# Patient Record
Sex: Female | Born: 1980 | Race: White | Hispanic: No | Marital: Single | Smoking: Never smoker
Health system: Southern US, Community
[De-identification: ages and names within clinical notes are randomized; demographics above are authoritative.]

## PROBLEM LIST (undated history)

## (undated) HISTORY — PX: COSMETIC SURGERY: SHX468

---

## 2005-06-23 ENCOUNTER — Observation Stay: Payer: Self-pay

## 2005-08-01 ENCOUNTER — Observation Stay: Payer: Self-pay | Admitting: Unknown Physician Specialty

## 2005-08-10 ENCOUNTER — Observation Stay: Payer: Self-pay | Admitting: Unknown Physician Specialty

## 2005-08-13 ENCOUNTER — Inpatient Hospital Stay: Payer: Self-pay | Admitting: Obstetrics & Gynecology

## 2009-10-04 ENCOUNTER — Ambulatory Visit: Payer: Self-pay | Admitting: Internal Medicine

## 2010-05-06 ENCOUNTER — Ambulatory Visit: Payer: Self-pay

## 2010-06-06 ENCOUNTER — Emergency Department: Payer: Self-pay | Admitting: Emergency Medicine

## 2010-08-13 ENCOUNTER — Emergency Department: Payer: Self-pay | Admitting: Unknown Physician Specialty

## 2011-04-08 ENCOUNTER — Inpatient Hospital Stay (HOSPITAL_COMMUNITY): Admission: AD | Admit: 2011-04-08 | Payer: Self-pay | Source: Ambulatory Visit | Admitting: Obstetrics & Gynecology

## 2011-10-02 ENCOUNTER — Emergency Department: Payer: Self-pay | Admitting: Emergency Medicine

## 2011-10-02 LAB — COMPREHENSIVE METABOLIC PANEL
Albumin: 4.3 g/dL (ref 3.4–5.0)
Anion Gap: 7 (ref 7–16)
BUN: 11 mg/dL (ref 7–18)
Bilirubin,Total: 0.9 mg/dL (ref 0.2–1.0)
Chloride: 103 mmol/L (ref 98–107)
Creatinine: 0.78 mg/dL (ref 0.60–1.30)
EGFR (African American): >60
Glucose: 107 mg/dL — ABNORMAL HIGH (ref 65–99)
Potassium: 4.5 mmol/L (ref 3.5–5.1)
Total Protein: 8 g/dL (ref 6.4–8.2)

## 2011-10-02 LAB — URINALYSIS, COMPLETE
Bacteria: NONE SEEN
Bilirubin,UR: NEGATIVE
Glucose,UR: NEGATIVE mg/dL (ref 0–75)
Leukocyte Esterase: NEGATIVE
RBC,UR: 85 /HPF (ref 0–5)
Squamous Epithelial: 2
WBC UR: 4 /HPF (ref 0–5)

## 2011-10-02 LAB — CBC WITH DIFFERENTIAL/PLATELET
Basophil #: 0 10*3/uL (ref 0.0–0.1)
Basophil %: 0.4 %
HGB: 13.3 g/dL (ref 12.0–16.0)
MCH: 31.4 pg (ref 26.0–34.0)
MCHC: 34 g/dL (ref 32.0–36.0)
MCV: 92 fL (ref 80–100)
Monocyte %: 5 %
Neutrophil #: 6.2 10*3/uL (ref 1.4–6.5)
Neutrophil %: 70.5 %
RBC: 4.23 10*6/uL (ref 3.80–5.20)

## 2011-10-02 LAB — PREGNANCY, URINE: Pregnancy Test, Urine: NEGATIVE m[IU]/mL

## 2011-10-09 ENCOUNTER — Emergency Department: Payer: Self-pay | Admitting: Emergency Medicine

## 2011-10-09 LAB — BASIC METABOLIC PANEL
Anion Gap: 11 (ref 7–16)
BUN: 12 mg/dL (ref 7–18)
Chloride: 101 mmol/L (ref 98–107)
Co2: 28 mmol/L (ref 21–32)
Osmolality: 279 (ref 275–301)
Potassium: 4.3 mmol/L (ref 3.5–5.1)
Sodium: 140 mmol/L (ref 136–145)

## 2011-10-09 LAB — URINALYSIS, COMPLETE
Bilirubin,UR: NEGATIVE
Nitrite: NEGATIVE
Ph: 7 (ref 4.5–8.0)
Protein: NEGATIVE
RBC,UR: 1 /HPF (ref 0–5)
Squamous Epithelial: 1
WBC UR: <1 /HPF (ref 0–5)

## 2011-10-09 LAB — CBC
HCT: 36.6 % (ref 35.0–47.0)
HGB: 12.3 g/dL (ref 12.0–16.0)
RDW: 11.8 % (ref 11.5–14.5)
WBC: 7 10*3/uL (ref 3.6–11.0)

## 2012-04-12 ENCOUNTER — Emergency Department: Payer: Self-pay | Admitting: *Deleted

## 2012-04-12 LAB — URINALYSIS, COMPLETE
Bilirubin,UR: NEGATIVE
Ketone: NEGATIVE
Leukocyte Esterase: NEGATIVE
Nitrite: NEGATIVE
Ph: 6 (ref 4.5–8.0)
Squamous Epithelial: <1

## 2012-04-12 LAB — CBC
MCH: 32 pg (ref 26.0–34.0)
MCHC: 34.9 g/dL (ref 32.0–36.0)
MCV: 92 fL (ref 80–100)
Platelet: 229 10*3/uL (ref 150–440)
RDW: 12.9 % (ref 11.5–14.5)
WBC: 10.5 10*3/uL (ref 3.6–11.0)

## 2012-04-12 LAB — WET PREP, GENITAL

## 2012-05-14 ENCOUNTER — Emergency Department: Payer: Self-pay | Admitting: Emergency Medicine

## 2012-05-14 LAB — COMPREHENSIVE METABOLIC PANEL
Albumin: 3.3 g/dL — ABNORMAL LOW (ref 3.4–5.0)
Alkaline Phosphatase: 83 U/L (ref 50–136)
Anion Gap: 9 (ref 7–16)
Calcium, Total: 9.4 mg/dL (ref 8.5–10.1)
Co2: 24 mmol/L (ref 21–32)
Creatinine: 0.43 mg/dL — ABNORMAL LOW (ref 0.60–1.30)
Glucose: 70 mg/dL (ref 65–99)
Osmolality: 269 (ref 275–301)
Potassium: 3.7 mmol/L (ref 3.5–5.1)
SGPT (ALT): 17 U/L (ref 12–78)
Sodium: 136 mmol/L (ref 136–145)
Total Protein: 7.2 g/dL (ref 6.4–8.2)

## 2012-05-14 LAB — CBC
HCT: 35.7 % (ref 35.0–47.0)
HGB: 12.4 g/dL (ref 12.0–16.0)
MCH: 31.7 pg (ref 26.0–34.0)
MCHC: 34.8 g/dL (ref 32.0–36.0)
MCV: 91 fL (ref 80–100)
RDW: 12.3 % (ref 11.5–14.5)
WBC: 10.8 10*3/uL (ref 3.6–11.0)

## 2012-05-14 LAB — HCG, QUANTITATIVE, PREGNANCY: Beta Hcg, Quant.: 55897 m[IU]/mL — ABNORMAL HIGH

## 2012-10-16 ENCOUNTER — Observation Stay: Payer: Self-pay | Admitting: Obstetrics & Gynecology

## 2012-10-25 ENCOUNTER — Inpatient Hospital Stay: Payer: Self-pay | Admitting: Obstetrics & Gynecology

## 2012-10-25 LAB — CBC WITH DIFFERENTIAL/PLATELET
Basophil #: 0 10*3/uL (ref 0.0–0.1)
Basophil %: 0.2 %
Eosinophil #: 0.1 10*3/uL (ref 0.0–0.7)
Eosinophil %: 0.3 %
HCT: 31.8 % — ABNORMAL LOW (ref 35.0–47.0)
Lymphocyte #: 2.1 10*3/uL (ref 1.0–3.6)
MCH: 29.6 pg (ref 26.0–34.0)
Monocyte #: 0.9 x10 3/mm (ref 0.2–0.9)
Platelet: 213 10*3/uL (ref 150–440)
RDW: 12.6 % (ref 11.5–14.5)

## 2012-10-26 ENCOUNTER — Observation Stay: Payer: Self-pay | Admitting: Obstetrics and Gynecology

## 2012-11-08 ENCOUNTER — Inpatient Hospital Stay: Payer: Self-pay | Admitting: Obstetrics and Gynecology

## 2012-11-08 LAB — CBC WITH DIFFERENTIAL/PLATELET
Basophil %: 0.2 %
Eosinophil %: 0.5 %
HGB: 10.6 g/dL — ABNORMAL LOW (ref 12.0–16.0)
Lymphocyte #: 2.2 10*3/uL (ref 1.0–3.6)
MCHC: 34.1 g/dL (ref 32.0–36.0)
Monocyte #: 1 x10 3/mm — ABNORMAL HIGH (ref 0.2–0.9)
Monocyte %: 6 %
Neutrophil %: 80.6 %
Platelet: 238 10*3/uL (ref 150–440)
RBC: 3.45 10*6/uL — ABNORMAL LOW (ref 3.80–5.20)
RDW: 12.9 % (ref 11.5–14.5)
WBC: 17.3 10*3/uL — ABNORMAL HIGH (ref 3.6–11.0)

## 2012-11-10 HISTORY — PX: TUBAL LIGATION: SHX77

## 2012-11-10 LAB — HEMATOCRIT: HCT: 27.6 % — ABNORMAL LOW (ref 35.0–47.0)

## 2013-03-05 IMAGING — CT CT STONE STUDY
1 of 2 series · 15 of 32 positions shown, 19 images · non-contrast
Comparison: none

REASON FOR EXAM: right flank pain, hematuria
COMMENTS:

PROCEDURE:     CT  - CT ABDOMEN /PELVIS WO (STONE)  - October 02, 2011  [DATE]
RESULT:     History: Right flank pain. Hematuria.
Comparison study: No recent.

[Series 2: stone · axial · 0.63mm/px · z∈[-418,-70]mm · 15 of 131 slices shown, 19 images]
[im 10/131  soft-tissue]
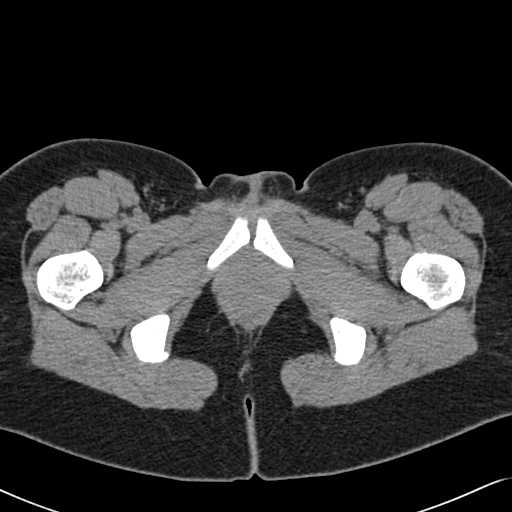
[im 10/131  bone]
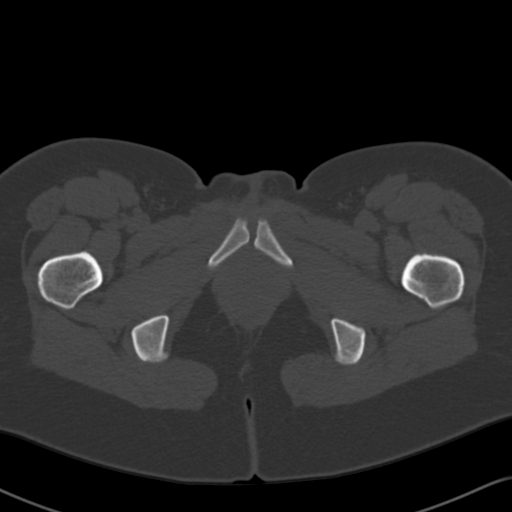
[im 19/131  soft-tissue]
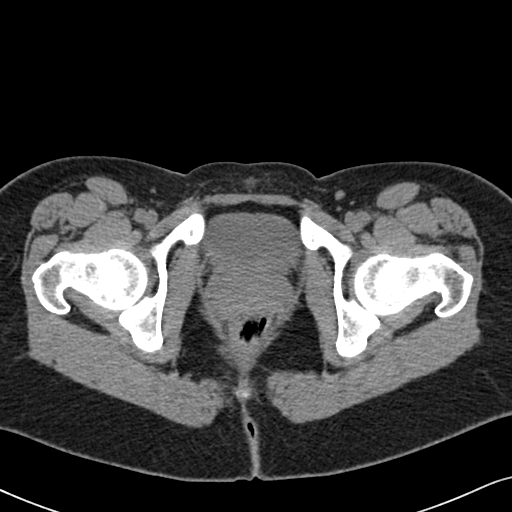
[im 28/131  soft-tissue]
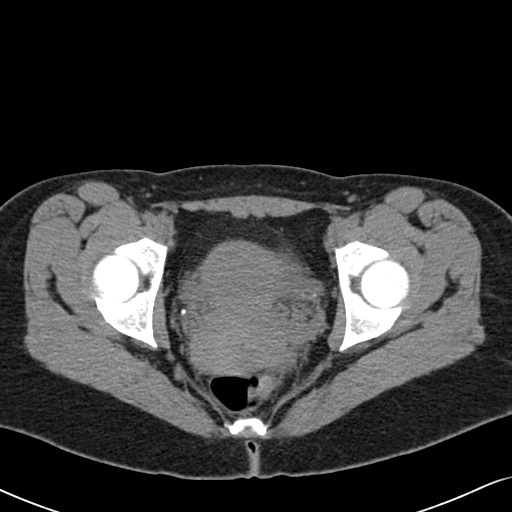
[im 38/131  soft-tissue]
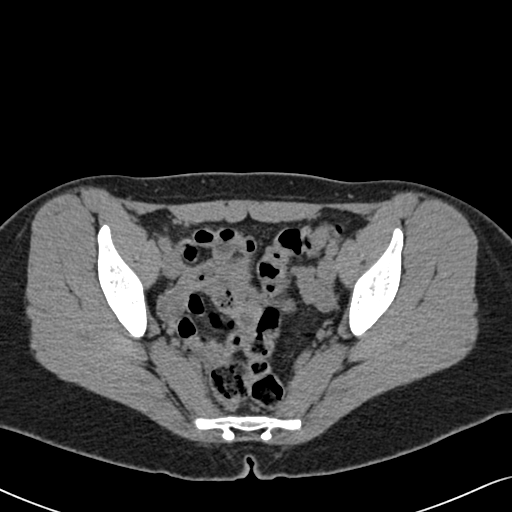
[im 47/131  soft-tissue]
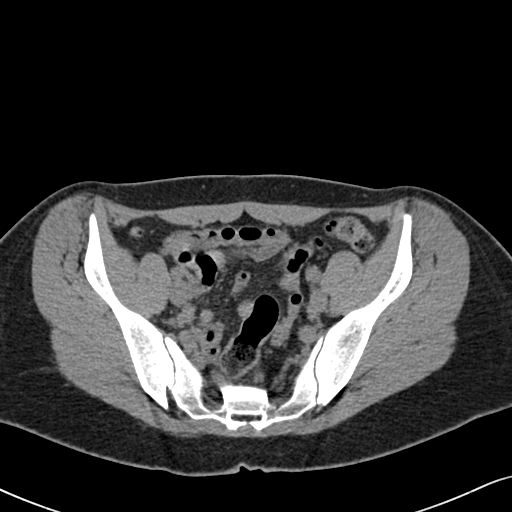
[im 56/131  soft-tissue]
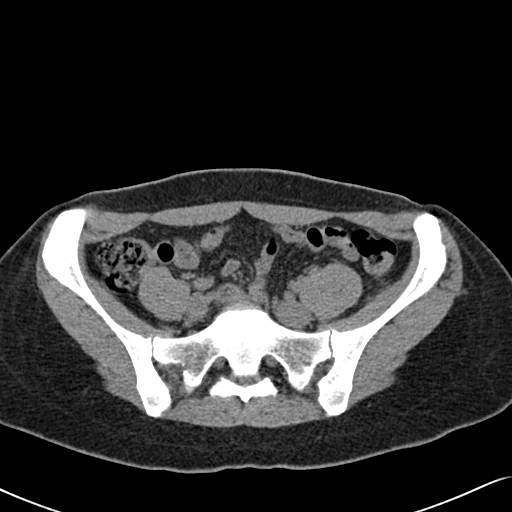
[im 66/131  soft-tissue]
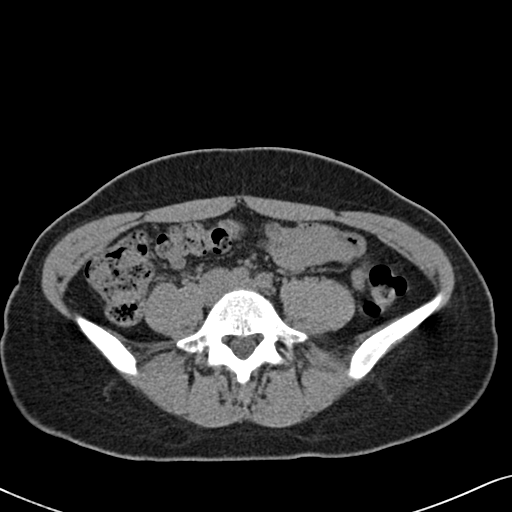
[im 75/131  soft-tissue]
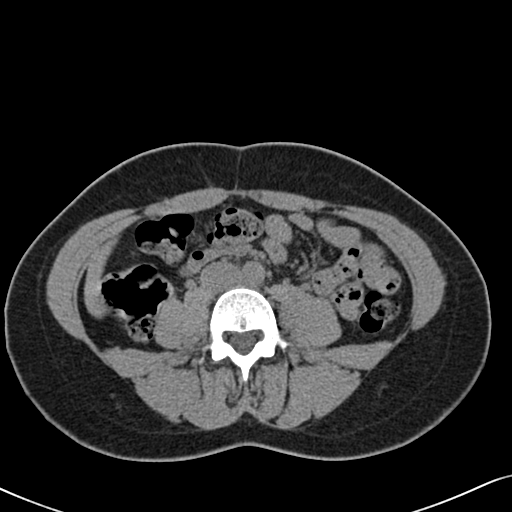
[im 84/131  soft-tissue]
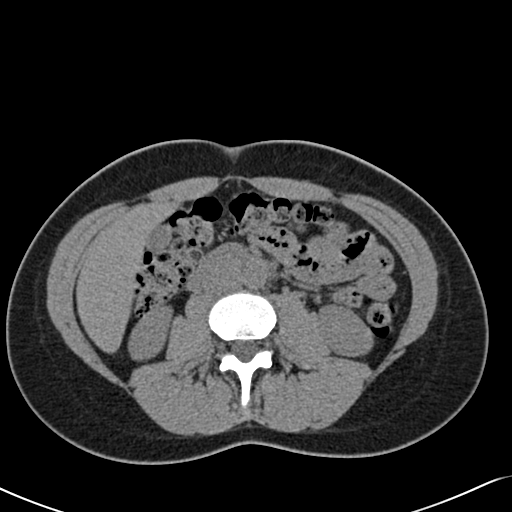
[im 84/131  bone]
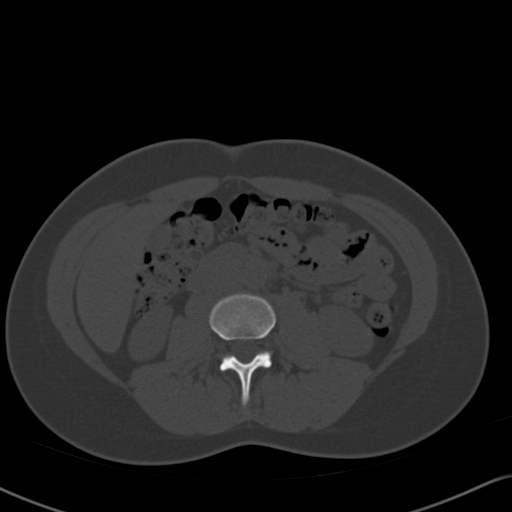
[im 93/131  soft-tissue]
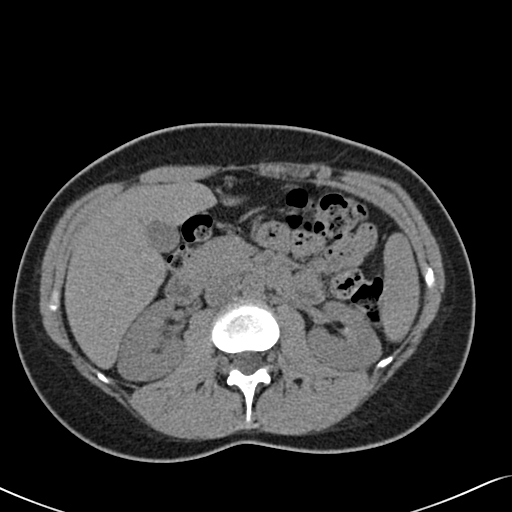
[im 103/131  soft-tissue]
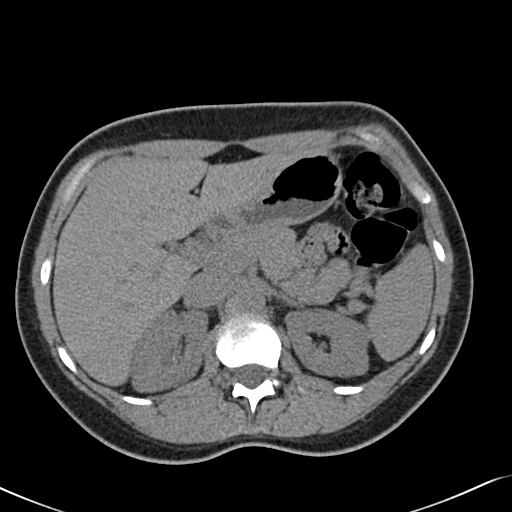
[im 112/131  soft-tissue]
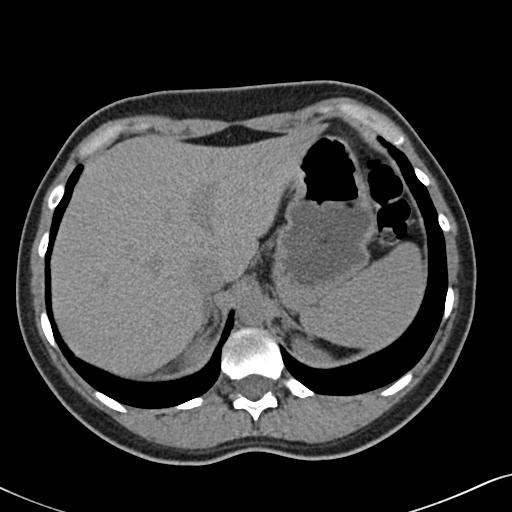
[im 112/131  lung]
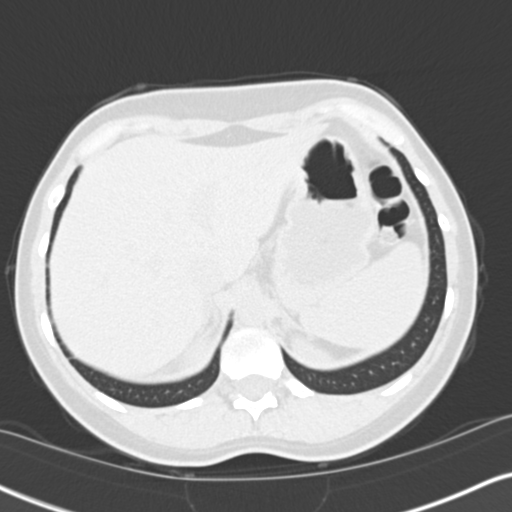
[im 117/131  lung]
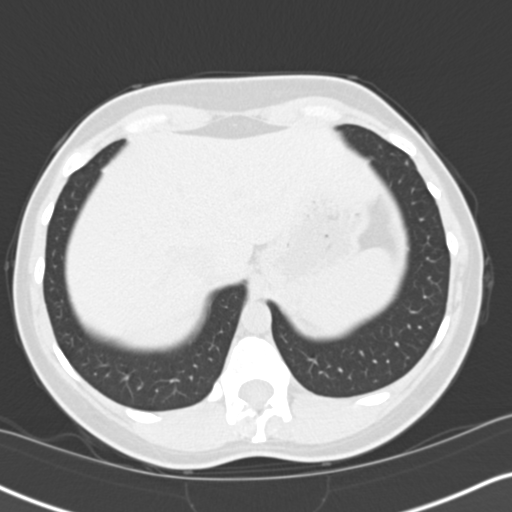
[im 121/131  soft-tissue]
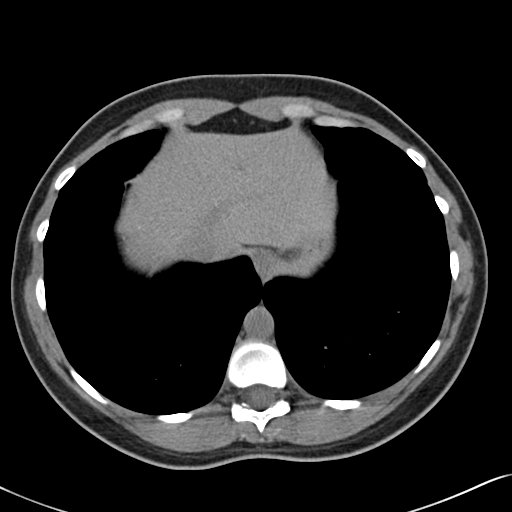
[im 121/131  lung]
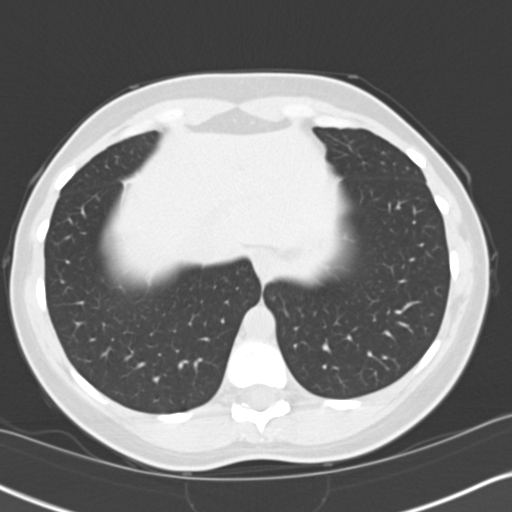
[im 126/131  lung]
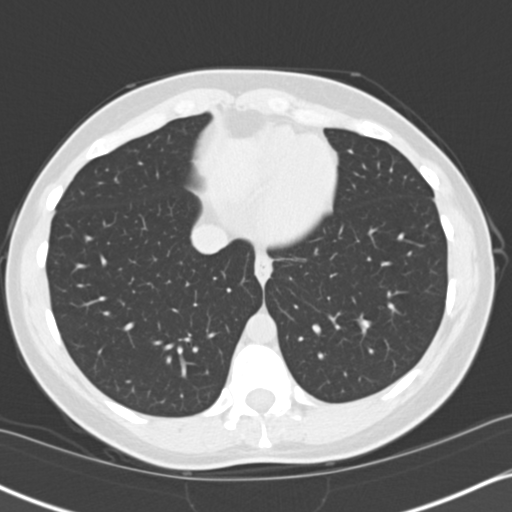

[15 of 32 positions shown; findings below may reference images not displayed]

FINDINGS: Standard nonenhanced CT obtained. Liver normal. Spleen normal.
Pancreas normal. Adrenals normal. Right kidney normal. Tiny left renal
calyceal stone. Phleboliths. No hydronephrosis or hydroureter. No
hydronephrosis noted. No definite ureteral stone noted. Small calcifications
noted in the region of the ovaries and/or adnexa. This is nonspecific.
Clinical correlation suggested and ultrasound can  be obtained if clinically
indicated. No free pelvic fluid noted. No bowel distention apparent appendix
normal. Aorta nondistended.
IMPRESSION: 1. Left nephrolithiasis. No obstructing ureteral stone.
2. Mild bilateral adnexal fullness with calcification in the region ovaries.
This is a nonspecific finding. Correlate clinically. Pelvic ultrasound can
be obtained if clinically indicated.

## 2013-09-14 IMAGING — US US OB < 14 WEEKS
1 series · 14 of 28 positions shown · non-contrast
Comparison: none

REASON FOR EXAM: vaginal bleeding in pregnancy
COMMENTS:

PROCEDURE:     US  - US OB LESS THAN 14 WEEKS  - April 12, 2012  [DATE]
RESULT:     First trimester OB ultrasound dated 04/12/2012.

[Series 1: us ob < 14 weeks · 0.30mm/px · 42 acquisitions, 14 frames shown]
[im 2/42]
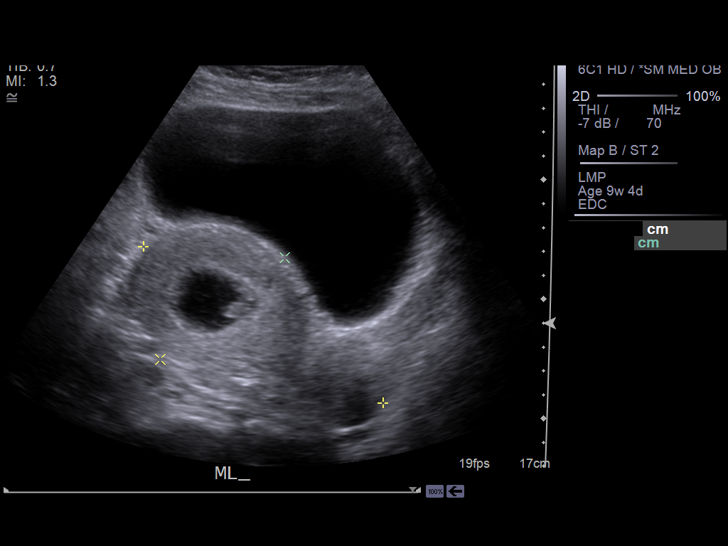
[im 5/42]
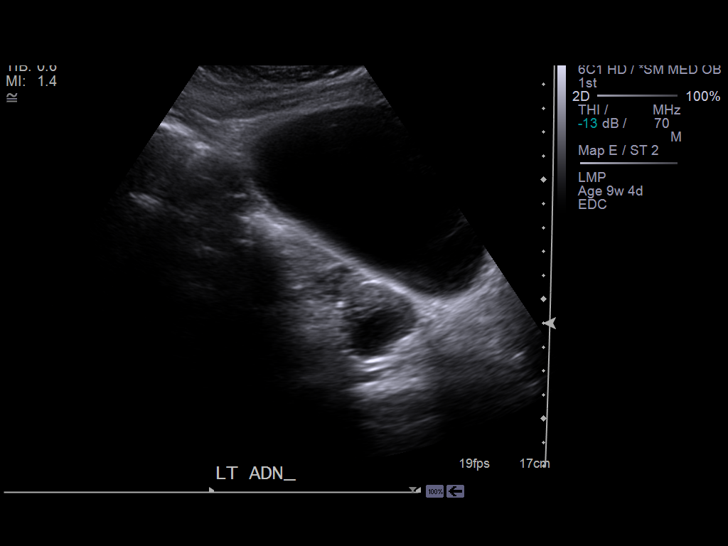
[im 8/42]
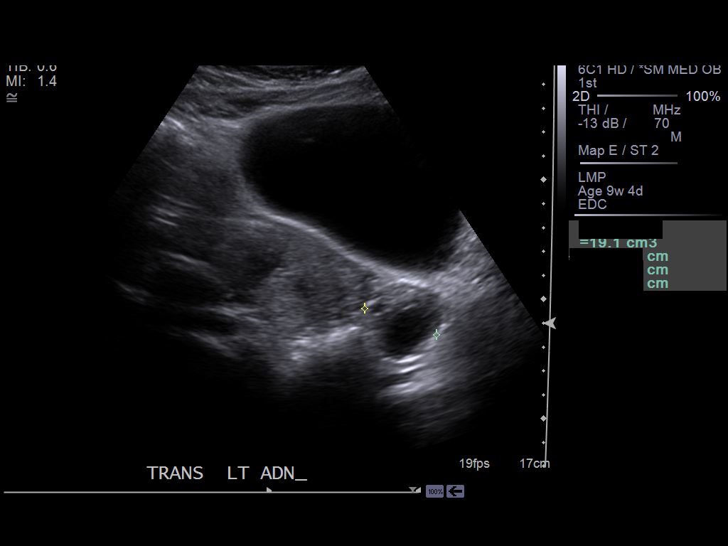
[im 11/42]
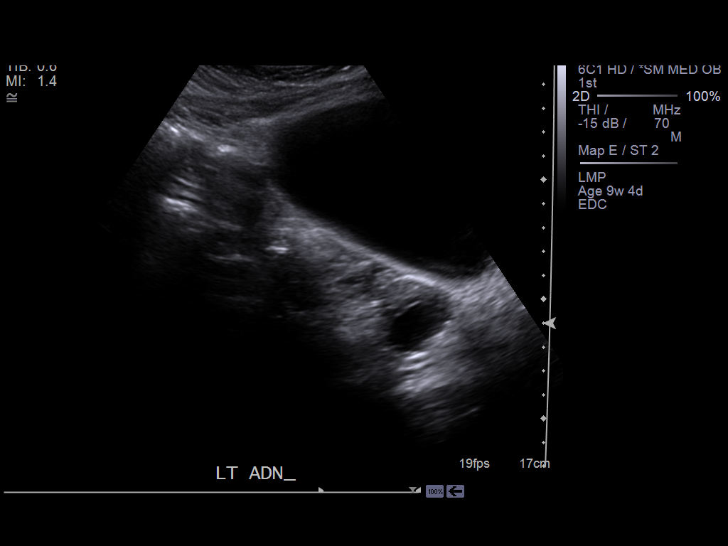
[im 14/42]
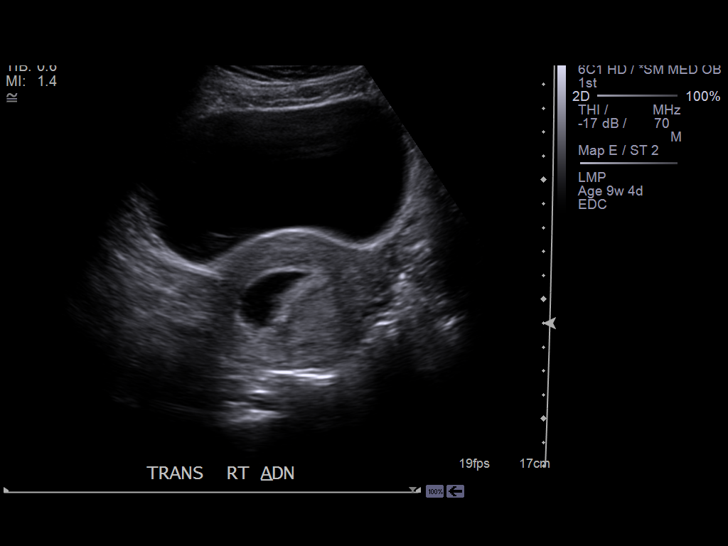
[im 17/42]
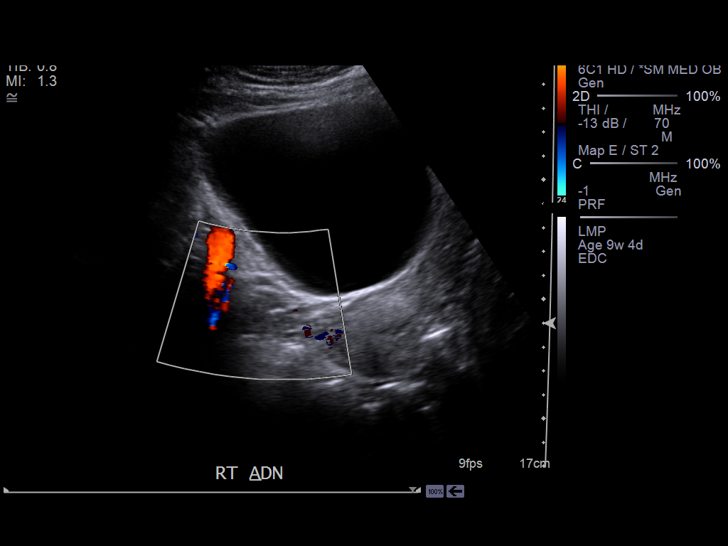
[im 20/42]
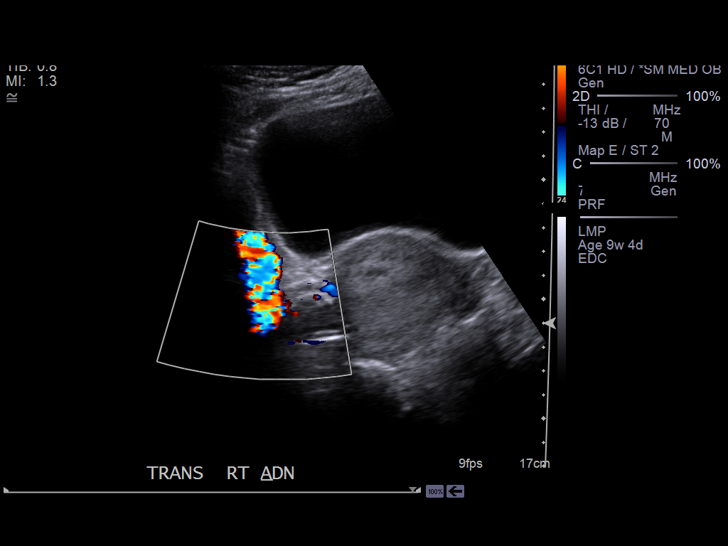
[im 23/42]
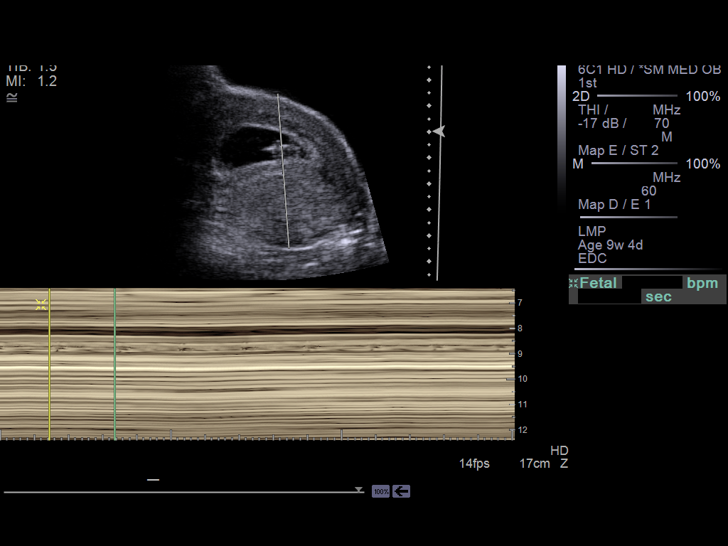
[im 26/42]
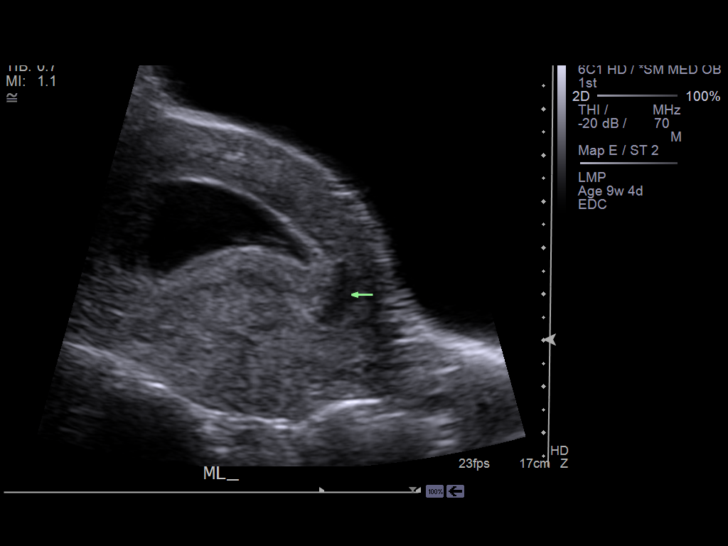
[im 29/42]
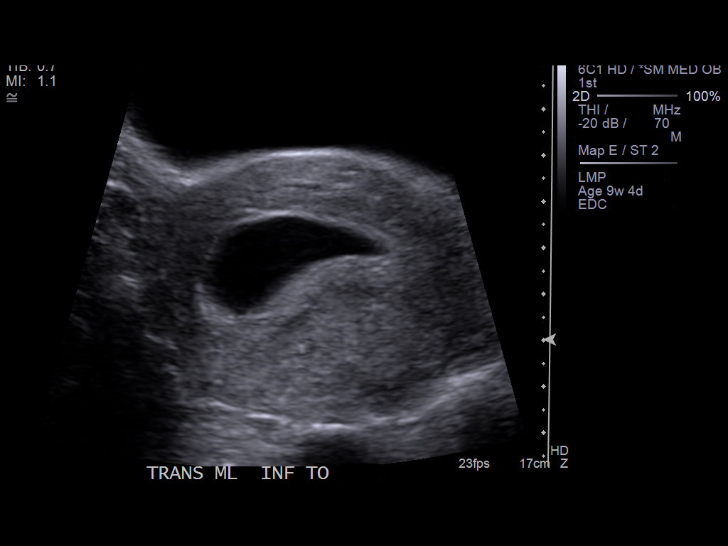
[im 32/42]
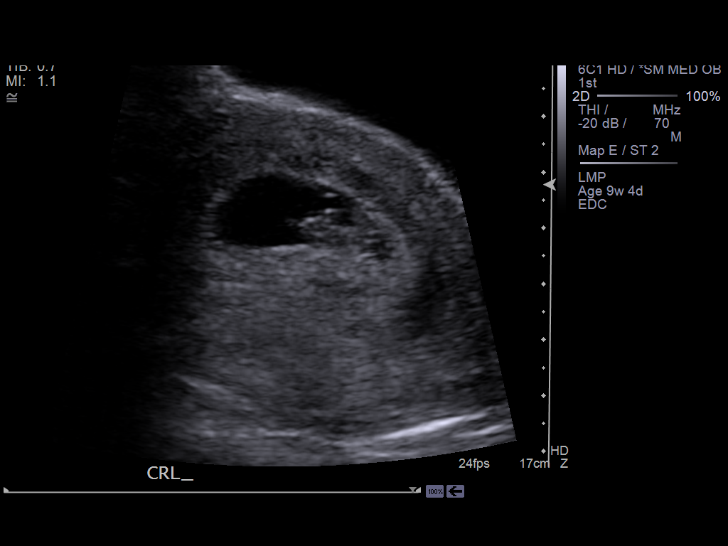
[im 35/42]
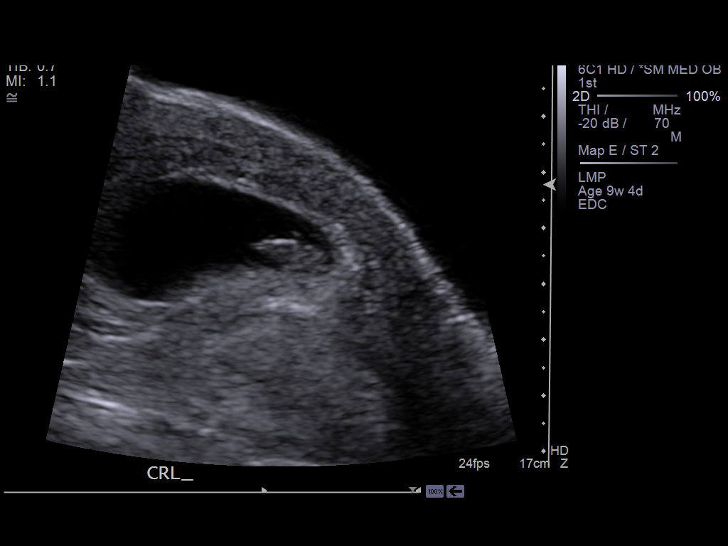
[im 38/42]
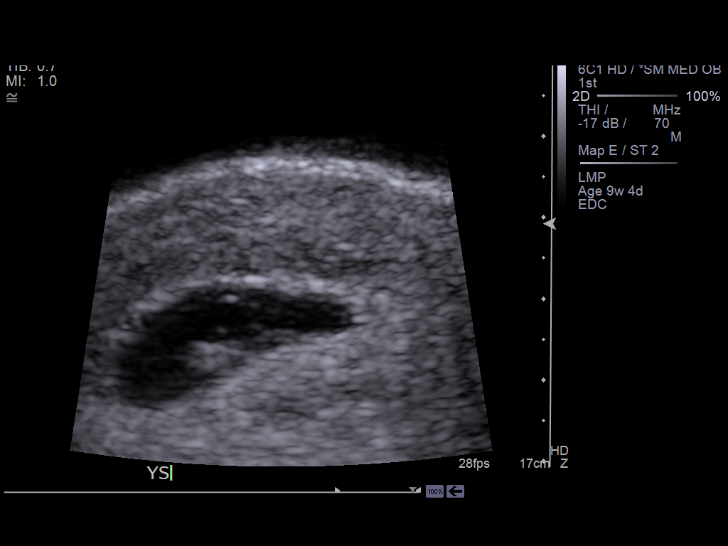
[im 42/42]
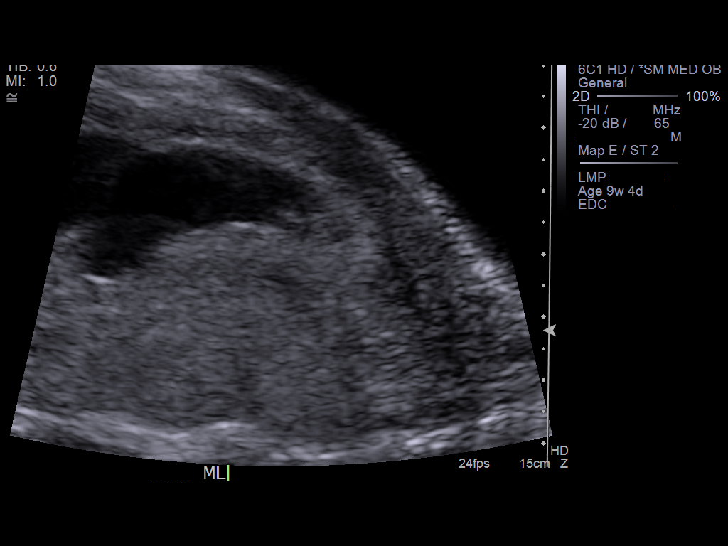

[14 of 28 positions shown; findings below may reference images not displayed]

FINDINGS: A single viable intrauterine pregnancy is identified. A fetal pole
and a yolk sac is appreciated. Crown rump length of 1.45 cm is appreciated
corresponding to estimated gestational age of 7 weeks 5 days with a fetal
heart rate of 157 beats per minute. The right ovary measures 3.3 x 1.4-1.8
cm and the left 3.4 x 3.4 x 3.2 centimeters. A 2.4 x 1.6 x 1.4 centimeter
left ovarian cyst is identified. A crescent shaped hypo to anechoic focus is
identified in a subchorionic location along the region of the lower uterine
segment measuring approximately 2.5 cm in longitudinal dimensions. This
finding is concerning for a subchorionic hemorrhage.

There is otherwise no evidence of pelvic free fluid nor loculated fluid
collections.
IMPRESSION: Single viable intrauterine pregnancy as described above.
2. Findings concerning for a small subchorionic hemorrhage. Surveillance
evaluation recommended if and as clinically warranted.

## 2014-08-30 DIAGNOSIS — R300 Dysuria: Secondary | ICD-10-CM | POA: Insufficient documentation

## 2014-12-25 NOTE — Op Note (Signed)
PATIENT NAME:  Kaitlyn Reynolds, [name] MR#:  [id number] DATE OF BIRTH:  [birth year]  DATE OF PROCEDURE:  11/08/2012  PREOPERATIVE DIAGNOSIS: Desires sterility.   POSTOPERATIVE DIAGNOSIS: Desires sterility.   PROCEDURE: Postpartum bilateral tubal ligation, modified [place] method.   SURGEON: Senaida LangeLashawn Weaver-Lee, M.D.   ANESTHESIA: Spinal.   ESTIMATED BLOOD LOSS: 25 mL.   COMPLICATIONS: None.   FINDINGS: Normal-appearing tubes.   SPECIMEN: Portion of right and left tubes.   INDICATIONS: The patient is a 34 year old who is status post vaginal delivery 1 day ago, who desires permanent sterilization. Risks, benefits, indications, and alternatives of the procedure were explained and informed consent was obtained.   PROCEDURE: The patient was taken to the operating room with IV fluids running. She was prepped and draped in the usual sterile fashion in the supine position. The infraumbilical region was injected with 0.5% Sensorcaine. A horizontal infraumbilical incision was made. The opening was extended using a hemostat clamp. The fascia was grasped with Kochers and entered sharply with the knife. This opening was extended. Two pieces of #0 Vicryl were placed on either side of the fascia to be repaired at the end of the procedure. The peritoneum was entered bluntly as retractors were used for visualization. A moist Ray-Tec sponge was used to pack back the bowel. The left tube was grasped, eventually, with some difficulty. It was felt that there were some adhesions of the fimbriated end of the tube to the uterus. An avascular window in the mesosalpinx was identified, and an opening was made in it with the Bovie cautery. Two pieces of plain gut suture were passed through the opening and the tube was tied 2 to 3 cm lateral to the uterine cornua. The tube was cut, the cut edges were made hemostatic, and the tube was returned to the abdomen. This was repeated on the patient's right tube. The previously-tagged  fascia was closed using #0 Vicryl, and the skin was closed with 4-0 Vicryl in a subcuticular fashion. The patient tolerated the procedure well. Sponge, needle, and instrument counts were correct x 2, and the patient was awakened from anesthesia and taken to the recovery room in stable condition.    ____________________________ Sonda PrimesLashawn A. Patton SallesWeaver-Lee, MD law:dm D: 11/10/2012 09:43:00 ET T: 11/10/2012 12:01:27 ET JOB#: [id number]  cc: Flint MelterLashawn A. Patton SallesWeaver-Lee, MD, <Dictator> Sonda PrimesLASHAWN A WEAVER LEE MD ELECTRONICALLY SIGNED 11/14/2012 10:15

## 2015-01-12 NOTE — H&P (Signed)
L&D Evaluation:  History Expanded:  HPI 10931 G5P2022 at 5535 6/7weeks w contractions and vag bleeding.  See prior note.  Ctxs have persisted and she has noted cervical change. [facility] with no compl.   Gravida 5   Term 2   PreTerm 0   Abortion 2   Living 2   Blood Type (Maternal) A positive   Presents with contractions, vaginal bleeding   Patient's Medical History No Chronic Illness   Patient's Surgical History none   Medications Pre Natal Vitamins  Iron   Allergies NKDA   Social History none   Family History Non-Contributory   ROS:  ROS All systems were reviewed.  HEENT, CNS, GI, GU, Respiratory, CV, Renal and Musculoskeletal systems were found to be normal.   Exam:  Vital Signs stable   General no apparent distress   Mental Status clear   Chest clear   Heart normal sinus rhythm, no murmur/gallop/rubs   Abdomen gravid, non-tender   Estimated Fetal Weight Average for gestational age   Edema no edema   Pelvic no external lesions, 5/80/-1   Mebranes Intact   FHT REACTIVE Non-Stress Test   Ucx regular   Skin dry   Plan:  Plan EFM/NST, monitor contractions and for cervical change, fluids, No signs of abruption   Comments As contractions and cervical change, anticipate continued labor. Patient has been counseled as to the risks of prematurity, including risks of respiratory depression or distress, jaundice, feeding or temperature regulation problems, neurologic concerns including hearing or visual problems, and brain complications.  Patient understands the risks of tocolytic therapies along with their inherent failure rates, and the reasons for and against their use in individual circumstances.   Follow Up Appointment already scheduled   Electronic Signatures: Letitia LibraHarris, Hira Trent Paul (MD)  (Signed 21-Feb-14 17:56)  Authored: L&D Evaluation   Last Updated: 21-Feb-14 17:56 by Letitia LibraHarris, Konstantine Gervasi Paul (MD)

## 2015-01-12 NOTE — H&P (Signed)
L&D Evaluation:  History Expanded:  HPI 2931 G5P2022 at 34+weeks w fall yesterday, on back, w no bleeding or abd pain. DFM since fall.  [facility] with no compl.   Gravida 5   Term 2   PreTerm 0   Abortion 2   Living 2   Blood Type (Maternal) A positive   Presents with decreased fetal movement, Fall   Patient's Medical History No Chronic Illness   Patient's Surgical History none   Medications Pre Natal Vitamins  Iron   Allergies NKDA   Social History none   Family History Non-Contributory   ROS:  ROS All systems were reviewed.  HEENT, CNS, GI, GU, Respiratory, CV, Renal and Musculoskeletal systems were found to be normal.   Exam:  Vital Signs stable   General no apparent distress   Mental Status clear   Abdomen gravid, non-tender   Estimated Fetal Weight Average for gestational age   Back Tender, no bruising or bony abnormalities   Edema no edema   FHT REACTIVE Non-Stress Test   Ucx Rare   Skin dry   Impression:  Impression Fall, DFM   Plan:  Plan EFM/NST, No signs of abruption   Follow Up Appointment already scheduled   Electronic Signatures: Letitia LibraHarris, Lawrence Roldan Paul (MD)  (Signed 12-Feb-14 10:29)  Authored: L&D Evaluation   Last Updated: 12-Feb-14 10:29 by Letitia LibraHarris, Armin Yerger Paul (MD)

## 2015-01-12 NOTE — H&P (Signed)
L&D Evaluation:  History Expanded:  HPI 31 G5P2022 at 37 weeks w contractions she has been 5 cm for a while and today comes in contracting every 2 minutes and she finally chnged her cervix after three hours to 100 % and 6. seh is GBS pos.  Ctxs have persisted and she has  been more uncomfortable, tdap on 09/17/12 [facility] with no compl.   Gravida 5   Term 2   PreTerm 0   Abortion 2   Living 2   Blood Type (Maternal) A positive   Group B Strep Results Maternal (Result >5wks must be treated as unknown) positive   Maternal HIV Negative   Maternal Syphilis Ab Nonreactive   Maternal Varicella Immune   Rubella Results (Maternal) immune   Maternal T-Dap Immune   [facility]   Presents with contractions, vaginal bleeding   Patient's Medical History No Chronic Illness   Patient's Surgical History none   Medications Pre Natal Vitamins  Iron   Allergies NKDA   Social History none   Family History Non-Contributory   ROS:  ROS All systems were reviewed.  HEENT, CNS, GI, GU, Respiratory, CV, Renal and Musculoskeletal systems were found to be normal.   Exam:  Vital Signs stable   General no apparent distress   Mental Status clear   Chest clear   Heart normal sinus rhythm, no murmur/gallop/rubs   Abdomen gravid, non-tender   Estimated Fetal Weight Average for gestational age   Edema no edema   Pelvic no external lesions, 6/100   Mebranes Intact   FHT REACTIVE Non-Stress Test   Ucx regular   Skin dry   Impression:  Impression active labor   Plan:  Plan EFM/NST, monitor contractions and for cervical change, fluids   Comments As contractions and cervical change, anticipate vaginal delivery Patient has been counseled as to the risks of prematurity, including risks of respiratory depression or distress, jaundice, feeding or temperature regulation problems, neurologic concerns including hearing or visual problems, and  brain complications. will admit and start on amp and give morphine 10 mg IM for pain and then stadol asneeded she wants to [facility] [place]   Follow Up Appointment in 6 weeks   Electronic Signatures: Adria DevonKlett, Carrie (MD)  (Signed 07-Mar-14 21:29)  Authored: L&D Evaluation   Last Updated: 07-Mar-14 21:29 by Adria DevonKlett, Carrie (MD)

## 2019-06-16 ENCOUNTER — Other Ambulatory Visit: Payer: Self-pay

## 2019-06-16 DIAGNOSIS — Z20822 Contact with and (suspected) exposure to covid-19: Secondary | ICD-10-CM

## 2019-06-17 LAB — NOVEL CORONAVIRUS, NAA: SARS-CoV-2, NAA: DETECTED — AB

## 2023-11-13 ENCOUNTER — Ambulatory Visit
Admission: EM | Admit: 2023-11-13 | Discharge: 2023-11-13 | Disposition: A | Discharge status: Home / Self Care | Attending: Physician Assistant | Admitting: Physician Assistant

## 2023-11-13 ENCOUNTER — Ambulatory Visit (INDEPENDENT_AMBULATORY_CARE_PROVIDER_SITE_OTHER)

## 2023-11-13 DIAGNOSIS — R509 Fever, unspecified: Secondary | ICD-10-CM

## 2023-11-13 DIAGNOSIS — R300 Dysuria: Secondary | ICD-10-CM

## 2023-11-13 DIAGNOSIS — R110 Nausea: Secondary | ICD-10-CM | POA: Diagnosis present

## 2023-11-13 DIAGNOSIS — K5900 Constipation, unspecified: Secondary | ICD-10-CM

## 2023-11-13 DIAGNOSIS — R109 Unspecified abdominal pain: Secondary | ICD-10-CM

## 2023-11-13 DIAGNOSIS — N10 Acute tubulo-interstitial nephritis: Secondary | ICD-10-CM

## 2023-11-13 LAB — URINALYSIS, W/ REFLEX TO CULTURE (INFECTION SUSPECTED)
Glucose, UA: NEGATIVE mg/dL
Ketones, ur: 15 mg/dL — AB
Leukocytes,Ua: NEGATIVE
Nitrite: NEGATIVE
Protein, ur: 100 mg/dL — AB
Specific Gravity, Urine: 1.025 (ref 1.005–1.030)
pH: 6 (ref 5.0–8.0)

## 2023-11-13 MED ORDER — CEFTRIAXONE SODIUM 1 G IJ SOLR
1.0000 g | Freq: Once | INTRAMUSCULAR | Status: AC
  Administered 2023-11-13: 1 g via INTRAMUSCULAR

## 2023-11-13 MED ORDER — ONDANSETRON 4 MG PO TBDP: 4.0000 mg | ORAL_TABLET | Freq: Three times a day (TID) | ORAL | 0 refills | Status: AC | PRN

## 2023-11-13 MED ORDER — CIPROFLOXACIN HCL 500 MG PO TABS: 500.0000 mg | ORAL_TABLET | Freq: Two times a day (BID) | ORAL | 0 refills | Status: AC

## 2023-11-13 NOTE — ED Triage Notes (Signed)
 Pt c/o fever,LLQ abd & back pain x3 days. Tmax 102.9 last night. States has felt heart racing. Denies any N/V/D. Has tried IBU last dose around 0700.

## 2023-11-13 NOTE — ED Provider Notes (Signed)
 [facility]    CSN: [id number] Arrival date & time: 11/13/23  1000      History   Chief Complaint Chief Complaint  Patient presents with   Fever   Abdominal Pain   Back Pain    HPI Kaitlyn Reynolds is a 43 y.o. female presenting for 2-day history of fever of 202 degrees with fatigue, left-sided flank pain with radiation to the left lower abdomen, urinary urgency and frequency.  Patient reports painful urination last week which seem to get a little better recently.  Temperature currently 100.8 degrees.  Has had ibuprofen today.  Denies cough, congestion, sore throat, chest pain, wheezing or shortness of breath.  Has had some nausea without vomiting and denies constipation.  No report of pelvic pain, vaginal discharge or concern for STI. Declines STI testing at this time.   HPI  History reviewed. No pertinent past medical history.  Patient Active Problem List   Diagnosis Date Noted   Dysuria 08/30/2014    Past Surgical History:  Procedure Laterality Date   COSMETIC SURGERY     TUBAL LIGATION  11/10/2012    OB History   No obstetric history on file.      Home Medications    Prior to Admission medications   Medication Sig Start Date End Date Taking? Authorizing Provider  ciprofloxacin (CIPRO) 500 MG tablet Take 1 tablet (500 mg total) by mouth every 12 (twelve) hours for 7 days. 11/13/23 11/20/23 Yes Eusebio Friendly B, PA-C  ondansetron (ZOFRAN-ODT) 4 MG disintegrating tablet Take 1 tablet (4 mg total) by mouth every 8 (eight) hours as needed. 11/13/23  Yes Shirlee Latch PA-C    Family History History reviewed. No pertinent family history.  Social History Social History   Tobacco Use   Smoking status: Never   Smokeless tobacco: Never  Vaping Use   Vaping status: Never Used  Substance Use Topics   Alcohol use: Yes    Comment: Social   Drug use: Never     Allergies   Patient has no known allergies.   Review of Systems Review of  Systems  Constitutional:  Positive for diaphoresis, fatigue and fever. Negative for chills.  Gastrointestinal:  Positive for abdominal pain and nausea. Negative for diarrhea and vomiting.  Genitourinary:  Positive for dysuria, frequency and urgency. Negative for decreased urine volume, flank pain, hematuria, pelvic pain, vaginal bleeding, vaginal discharge and vaginal pain.  Musculoskeletal:  Positive for back pain.  Skin:  Negative for rash.     Physical Exam Triage Vital Signs ED Triage Vitals  Encounter Vitals Group     BP 11/13/23 1052 130/89     Systolic BP Percentile --      Diastolic BP Percentile --      Pulse Rate 11/13/23 1052 (!) 116     Resp 11/13/23 1052 16     Temp 11/13/23 1052 (!) 100.8 F (38.2 C)     Temp Source 11/13/23 1052 Oral     SpO2 11/13/23 1052 96 %     Weight 11/13/23 1050 126 lb (57.2 kg)     Height 11/13/23 1050 5\' 3"  (1.6 m)     Head Circumference --      Peak Flow --      Pain Score 11/13/23 1055 8     Pain Loc --      Pain Education --      Exclude from Growth Chart --    No data found.  Updated Vital  Signs BP 130/89 (BP Location: Left Arm)   Pulse (!) 116   Temp (!) 100.8 F (38.2 C) (Oral)   Resp 16   Ht 5\' 3"  (1.6 m)   Wt 126 lb (57.2 kg)   LMP 10/22/2023   SpO2 96%   BMI 22.32 kg/m       Physical Exam Vitals and nursing note reviewed.  Constitutional:      General: She is not in acute distress.    Appearance: Normal appearance. She is ill-appearing. She is not toxic-appearing.  HENT:     Head: Normocephalic and atraumatic.     Nose: Nose normal.     Mouth/Throat:     Mouth: Mucous membranes are moist.     Pharynx: Oropharynx is clear.  Eyes:     General: No scleral icterus.       Right eye: No discharge.        Left eye: No discharge.     Conjunctiva/sclera: Conjunctivae normal.  Cardiovascular:     Rate and Rhythm: Regular rhythm. Tachycardia present.     Heart sounds: Normal heart sounds.  Pulmonary:      Effort: Pulmonary effort is normal. No respiratory distress.     Breath sounds: Normal breath sounds.  Abdominal:     Palpations: Abdomen is soft.     Tenderness: There is abdominal tenderness (LLQ). There is left CVA tenderness (Significant, patient jumps when I palpate). There is no right CVA tenderness.  Musculoskeletal:     Cervical back: Neck supple.  Skin:    General: Skin is dry.  Neurological:     General: No focal deficit present.     Mental Status: She is alert. Mental status is at baseline.     Motor: No weakness.     Gait: Gait normal.  Psychiatric:        Mood and Affect: Mood normal.        Behavior: Behavior normal.      UC Treatments / Results  Labs (all labs ordered are listed, but only abnormal results are displayed) Labs Reviewed  URINALYSIS, W/ REFLEX TO CULTURE (INFECTION SUSPECTED) - Abnormal; Notable for the following components:      Result Value   Color, Urine AMBER (*)    Hgb urine dipstick MODERATE (*)    Bilirubin Urine SMALL (*)    Ketones, ur 15 (*)    Protein, ur 100 (*)    Bacteria, UA FEW (*)    All other components within normal limits  URINE CULTURE    EKG   Radiology DG Abdomen 1 View Result Date: 11/13/2023 CLINICAL DATA:  Fever.  Left flank pain.  Hematuria. EXAM: ABDOMEN - 1 VIEW COMPARISON:  None Available. FINDINGS: Lung bases are clear. Stool throughout the colon. Nonobstructed bowel-gas pattern. Pelvic phleboliths right hemipelvis. Lumbar spine degenerative changes. Probable punctate right renal stone. IMPRESSION: Stool throughout the colon as can be seen with constipation. Electronically Signed   By: Annia Belt M.D.   On: 11/13/2023 14:23    Procedures Procedures (including critical care time)  Medications Ordered in UC Medications  cefTRIAXone (ROCEPHIN) injection 1 g (1 g Intramuscular Given 11/13/23 1154)    Initial Impression / Assessment and Plan / UC Course  I have reviewed the triage vital signs and the nursing  notes.  Pertinent labs & imaging results that were available during my care of the patient were reviewed by me and considered in my medical decision making (see chart for details).  43 year old female presents for fever, fatigue and left flank pain for the past 2 days.  Reports dysuria, frequency and urgency last week which seemed to have improved.  Has taken ibuprofen for fever about 4 hours ago.  Current temperature 100.8 degrees.  Patient is tachycardic at 116 bpm.  Ill-appearing but nontoxic.  Normal HEENT exam.  Chest clear.  Heart regular rhythm but tachycardic.  Abdomen soft with tenderness of the left lower quadrant.  Significant left CVA tenderness.  UA obtained today shows amber color with moderate hemoglobin, small bili, ketones, protein, few bacteria, white blood cell clumps and budding yeast.  Will send urine for culture.  KUB obtained to assess for possible renal stone. She says she has had kidney stones in the past.  Wet read negative.  Suspect acute pyelonephritis given patient's symptoms and overall presentation.  She was given 1 g IM Rocephin in clinic.  Sent Cipro 500 mg twice daily x 7 days to pharmacy.  Will amend treatment based on culture if necessary.  Encourage increasing rest and fluids.  May continue with antibiotics for fever but explained she should be breaking the fever and feeling better in the next 2 days.  If she is not or any symptoms worsen she is to go to the emergency department immediately.  Patient is understanding and agreeable.  KUB negative for stone.  Significant for constipation.  No change to treatment plan.  Final Clinical Impressions(s) / UC Diagnoses   Final diagnoses:  Dysuria  Left flank pain  Acute pyelonephritis  Nausea without vomiting  Fever, unspecified  Constipation, unspecified constipation type     Discharge Instructions      UTI: Based on either symptoms or urinalysis, you may have a urinary tract infection. We will send the  urine for culture and call with results in a few days. Begin antibiotics at this time. Your symptoms should be much improved over the next 2-3 days. Increase rest and fluid intake. If for some reason symptoms are worsening or not improving after a couple of days or the urine culture determines the antibiotics you are taking will not treat the infection, the antibiotics may be changed. Return or go to ER for fever, back pain, worsening urinary pain, discharge, increased blood in urine. May take Tylenol or Motrin OTC for pain relief or consider AZO if no contraindications   -Your symptoms are consistent with a kidney infection.  Will amend treatment based on the culture result but you were given an antibiotic injection in the clinic.  Start the Cipro right away.  I also sent nausea medicine.  Increase fluid intake significantly. -Will call if xray shows kidney stone - If you are not breaking the fever in the next 2 days or pain worsens you need to go to the emergency department.     ED Prescriptions     Medication Sig Dispense Auth. Provider   ciprofloxacin (CIPRO) 500 MG tablet Take 1 tablet (500 mg total) by mouth every 12 (twelve) hours for 7 days. 14 tablet Eusebio Friendly B, PA-C   ondansetron (ZOFRAN-ODT) 4 MG disintegrating tablet Take 1 tablet (4 mg total) by mouth every 8 (eight) hours as needed. 15 tablet Gareth Morgan      PDMP not reviewed this encounter.   Shirlee Latch, PA-C 11/13/23 1440

## 2023-11-13 NOTE — Discharge Instructions (Addendum)
 UTI: Based on either symptoms or urinalysis, you may have a urinary tract infection. We will send the urine for culture and call with results in a few days. Begin antibiotics at this time. Your symptoms should be much improved over the next 2-3 days. Increase rest and fluid intake. If for some reason symptoms are worsening or not improving after a couple of days or the urine culture determines the antibiotics you are taking will not treat the infection, the antibiotics may be changed. Return or go to ER for fever, back pain, worsening urinary pain, discharge, increased blood in urine. May take Tylenol or Motrin OTC for pain relief or consider AZO if no contraindications   -Your symptoms are consistent with a kidney infection.  Will amend treatment based on the culture result but you were given an antibiotic injection in the clinic.  Start the Cipro right away.  I also sent nausea medicine.  Increase fluid intake significantly. -Will call if xray shows kidney stone - If you are not breaking the fever in the next 2 days or pain worsens you need to go to the emergency department.

## 2023-11-15 LAB — URINE CULTURE: Culture: 10000 — AB
